# Patient Record
Sex: Female | Born: 1975
Health system: Southern US, Community
[De-identification: ages and names within clinical notes are randomized; demographics above are authoritative.]

## PROBLEM LIST (undated history)

## (undated) DIAGNOSIS — I10 Essential (primary) hypertension: Secondary | ICD-10-CM

## (undated) DIAGNOSIS — E119 Type 2 diabetes mellitus without complications: Secondary | ICD-10-CM

## (undated) HISTORY — PX: APPENDECTOMY: SHX54

---

## 2003-03-16 ENCOUNTER — Other Ambulatory Visit: Admission: RE | Admit: 2003-03-16 | Discharge: 2003-03-16 | Payer: Self-pay | Admitting: Obstetrics and Gynecology

## 2011-06-26 ENCOUNTER — Emergency Department (HOSPITAL_BASED_OUTPATIENT_CLINIC_OR_DEPARTMENT_OTHER)
Admission: EM | Admit: 2011-06-26 | Discharge: 2011-06-26 | Disposition: A | Discharge status: Home / Self Care | Payer: Self-pay | Attending: Emergency Medicine | Admitting: Emergency Medicine

## 2011-06-26 DIAGNOSIS — N390 Urinary tract infection, site not specified: Secondary | ICD-10-CM | POA: Insufficient documentation

## 2011-06-26 DIAGNOSIS — R109 Unspecified abdominal pain: Secondary | ICD-10-CM | POA: Insufficient documentation

## 2011-06-26 LAB — URINALYSIS, ROUTINE W REFLEX MICROSCOPIC
Glucose, UA: NEGATIVE mg/dL
Hgb urine dipstick: NEGATIVE
Ketones, ur: NEGATIVE mg/dL
Protein, ur: NEGATIVE mg/dL

## 2011-06-26 LAB — URINE MICROSCOPIC-ADD ON

## 2011-06-27 LAB — URINE CULTURE
Colony Count: NO GROWTH
Culture: NO GROWTH

## 2011-10-24 ENCOUNTER — Emergency Department (HOSPITAL_BASED_OUTPATIENT_CLINIC_OR_DEPARTMENT_OTHER)
Admission: EM | Admit: 2011-10-24 | Discharge: 2011-10-25 | Disposition: A | Discharge status: Home / Self Care | Payer: Self-pay | Attending: Emergency Medicine | Admitting: Emergency Medicine

## 2011-10-24 ENCOUNTER — Encounter: Payer: Self-pay | Admitting: *Deleted

## 2011-10-24 DIAGNOSIS — M79652 Pain in left thigh: Secondary | ICD-10-CM

## 2011-10-24 DIAGNOSIS — M79609 Pain in unspecified limb: Secondary | ICD-10-CM | POA: Insufficient documentation

## 2011-10-24 DIAGNOSIS — R109 Unspecified abdominal pain: Secondary | ICD-10-CM | POA: Insufficient documentation

## 2011-10-24 MED ORDER — ENOXAPARIN SODIUM 100 MG/ML ~~LOC~~ SOLN
1.0000 mg/kg | Freq: Once | SUBCUTANEOUS | Status: AC
  Administered 2011-10-25: 100 mg via SUBCUTANEOUS
  Filled 2011-10-24: qty 1

## 2011-10-24 NOTE — ED Provider Notes (Signed)
History     CSN: [id number] Arrival date & time: 10/24/2011 10:16 PM   First MD Initiated Contact with Patient 10/24/11 2317      Chief Complaint  Patient presents with  . Groin Pain    (Consider location/radiation/quality/duration/timing/severity/associated sxs/prior treatment) HPI This is a 35 year old black female with a history of pain in her left groin fold radiating down the anterior left thigh. It started yesterday. It is intermittent. It is described as a sharp pain of moderate intensity. It is not brought on by any activity. It is not mitigated by anything. There is no associated swelling, redness, fever, chills, dyspnea or chest pain. She denies recent injury or excessive use of that leg.  History reviewed. No pertinent past medical history.  Past Surgical History  Procedure Date  . Appendectomy   . Cesarean section     History reviewed. No pertinent family history.  History  Substance Use Topics  . Smoking status: Never Smoker   . Smokeless tobacco: Not on file  . Alcohol Use: No    OB History    Grav Para Term Preterm Abortions TAB SAB Ect Mult Living                  Review of Systems  All other systems reviewed and are negative.    Allergies  Penicillins  Home Medications   Current Outpatient Rx  Name Route Sig Dispense Refill  . TETRAHYDROZOLINE-ZN SULFATE 0.05-0.25 % OP SOLN Both Eyes Place 1-2 drops into both eyes once as needed. For dryness and itching       BP 135/97  Pulse 92  Temp 97.5 F (36.4 C)  Resp 16  Ht 5\' 5"  (1.651 m)  Wt 220 lb (99.791 kg)  BMI 36.61 kg/m2  SpO2 100%  LMP 10/11/2011  Physical Exam General: Well-developed, well-nourished female in no acute distress; appearance consistent with age of record HENT: normocephalic, atraumatic Eyes: Normal appearance Neck: supple Heart: regular rate and rhythm Lungs: clear to auscultation bilaterally Abdomen: soft; nontender; nondistended; bowel sounds  present Extremities: No deformity; full range of motion; pulses normal; left groin fold and thigh without tenderness, swelling, erythema, warmth or inguinal lymphadenopathy Neurologic: Awake, alert ; motor function intact in all extremities and symmetric; no facial droop Skin: Warm and dry Psychiatric: Normal mood and affect    ED Course  Procedures (including critical care time)   MDM   Nursing notes and vitals signs, including pulse oximetry, reviewed.  Summary of this visit's results, reviewed by myself:  Labs:  Results for orders placed during the hospital encounter of 10/24/11  D-DIMER, QUANTITATIVE      Component Value Range   D-Dimer, Quant 1.15 (*) 0.00 - 0.48 (ug/mL-FEU)   Will administer Lovenox have patient return for Doppler study.         Hanley Seamen, MD 10/24/11 2352

## 2011-10-24 NOTE — ED Notes (Signed)
Pt c/o left groin pain which radiates down to thigh

## 2011-10-26 ENCOUNTER — Other Ambulatory Visit (HOSPITAL_BASED_OUTPATIENT_CLINIC_OR_DEPARTMENT_OTHER): Payer: Self-pay | Admitting: Emergency Medicine

## 2011-10-26 ENCOUNTER — Inpatient Hospital Stay (HOSPITAL_BASED_OUTPATIENT_CLINIC_OR_DEPARTMENT_OTHER): Admit: 2011-10-26 | Payer: Self-pay

## 2011-10-26 ENCOUNTER — Ambulatory Visit (HOSPITAL_BASED_OUTPATIENT_CLINIC_OR_DEPARTMENT_OTHER): Admit: 2011-10-26 | Payer: Self-pay

## 2011-10-26 ENCOUNTER — Ambulatory Visit (HOSPITAL_BASED_OUTPATIENT_CLINIC_OR_DEPARTMENT_OTHER)
Admission: RE | Admit: 2011-10-26 | Discharge: 2011-10-26 | Disposition: A | Discharge status: Home / Self Care | Payer: Self-pay | Source: Ambulatory Visit | Attending: Emergency Medicine | Admitting: Emergency Medicine

## 2011-10-26 DIAGNOSIS — R52 Pain, unspecified: Secondary | ICD-10-CM

## 2011-10-26 DIAGNOSIS — M79609 Pain in unspecified limb: Secondary | ICD-10-CM | POA: Insufficient documentation

## 2011-12-03 ENCOUNTER — Emergency Department (HOSPITAL_BASED_OUTPATIENT_CLINIC_OR_DEPARTMENT_OTHER)
Admission: EM | Admit: 2011-12-03 | Discharge: 2011-12-03 | Disposition: A | Discharge status: Home / Self Care | Payer: Self-pay | Attending: Emergency Medicine | Admitting: Emergency Medicine

## 2011-12-03 ENCOUNTER — Encounter (HOSPITAL_BASED_OUTPATIENT_CLINIC_OR_DEPARTMENT_OTHER): Payer: Self-pay | Admitting: Emergency Medicine

## 2011-12-03 DIAGNOSIS — R51 Headache: Secondary | ICD-10-CM | POA: Insufficient documentation

## 2011-12-03 DIAGNOSIS — J329 Chronic sinusitis, unspecified: Secondary | ICD-10-CM | POA: Insufficient documentation

## 2011-12-03 MED ORDER — OXYMETAZOLINE HCL 0.05 % NA SOLN
1.0000 | Freq: Two times a day (BID) | NASAL | Status: DC | PRN
  Filled 2011-12-03: qty 15

## 2011-12-03 MED ORDER — DOXYCYCLINE HYCLATE 100 MG PO TABS: 100.0000 mg | ORAL_TABLET | Freq: Two times a day (BID) | ORAL | Status: AC

## 2011-12-03 NOTE — ED Provider Notes (Signed)
I saw and evaluated the patient, reviewed the resident's note and I agree with the findings and plan. Patient with symptoms of sinusitis with tenderness over the left sinus. Otherwise well-appearing  Gwyneth Sprout, MD 12/03/11 [phone]

## 2011-12-03 NOTE — ED Notes (Signed)
Pt c/o headache with left sided facial discomfort, sore throat, congestion

## 2011-12-03 NOTE — ED Provider Notes (Signed)
History     CSN: [id number] Arrival date & time: 12/03/2011 10:38 AM   First MD Initiated Contact with Patient 12/03/11 1054      Chief Complaint  Patient presents with  . Headache    (Consider location/radiation/quality/duration/timing/severity/associated sxs/prior treatment) HPI Patient is an otherwise healthy 35 year old woman who complains of 3-4 days of congestion, bilateral ear fullness, bitemporal headache that is now left-sided, mild sore throat, and rare cough. Patient also complained of left-sided tooth pain of the maxillary teeth yesterday. Also describes scant blood on tissue when cleaning her left nare. She denies fevers or chills. She works with children.  History reviewed. No pertinent past medical history.  Past Surgical History  Procedure Date  . Appendectomy   . Cesarean section     No family history on file.  History  Substance Use Topics  . Smoking status: Never Smoker   . Smokeless tobacco: Not on file  . Alcohol Use: No    OB History    Grav Para Term Preterm Abortions TAB SAB Ect Mult Living                  Review of Systems As per history of present illness Allergies  Penicillins  Home Medications   Current Outpatient Rx  Name Route Sig Dispense Refill  . TETRAHYDROZOLINE-ZN SULFATE 0.05-0.25 % OP SOLN Both Eyes Place 1-2 drops into both eyes once as needed. For dryness and itching       BP 130/87  Pulse 89  Temp(Src) 98.7 F (37.1 C) (Oral)  Resp 18  Ht 5\' 6"  (1.676 m)  Wt 225 lb (102.059 kg)  BMI 36.32 kg/m2  SpO2 100%  LMP 11/15/2011  Physical Exam Physical Exam GEN: NAD.  Alert and oriented x 3.  Pleasant, conversant, and cooperative to exam. HEENT: NCAT.  TM's clear b/l with good light reflex.  PERRLA.  EOMI.  No blood in nasopharynx.  Oropharynx clear, non erythematous, non edematous.  No cervical LAD.  No TTP over frontal or maxillary sinuses. RESP:  CTAB, no w/r/r Skin: no rashes, normal turgor  ED Course    Procedures (including critical care time)  Labs Reviewed - No data to display No results found.   No diagnosis found.    MDM  35 year old woman with likely sinusitis, with symptoms now for nearly 6 days. She did describe unilateral headache, and left upper tooth pain, concerning for bacterial sinusitis. She is, however, very well-appearing and is afebrile with normal vitals. We will prescribe her Afrin for 2 days, and if she does not have improvement, we will have a prescription for doxycycline for her. She was amenable to this plan.        Kathreen Cosier, MD 12/03/11 1125

## 2012-01-13 ENCOUNTER — Emergency Department (HOSPITAL_BASED_OUTPATIENT_CLINIC_OR_DEPARTMENT_OTHER)
Admission: EM | Admit: 2012-01-13 | Discharge: 2012-01-13 | Disposition: A | Discharge status: Home / Self Care | Payer: Self-pay | Attending: Emergency Medicine | Admitting: Emergency Medicine

## 2012-01-13 ENCOUNTER — Encounter (HOSPITAL_BASED_OUTPATIENT_CLINIC_OR_DEPARTMENT_OTHER): Payer: Self-pay

## 2012-01-13 DIAGNOSIS — N898 Other specified noninflammatory disorders of vagina: Secondary | ICD-10-CM | POA: Insufficient documentation

## 2012-01-13 DIAGNOSIS — R350 Frequency of micturition: Secondary | ICD-10-CM | POA: Insufficient documentation

## 2012-01-13 DIAGNOSIS — R109 Unspecified abdominal pain: Secondary | ICD-10-CM | POA: Insufficient documentation

## 2012-01-13 DIAGNOSIS — R300 Dysuria: Secondary | ICD-10-CM | POA: Insufficient documentation

## 2012-01-13 LAB — URINALYSIS, ROUTINE W REFLEX MICROSCOPIC
Glucose, UA: NEGATIVE mg/dL
Hgb urine dipstick: NEGATIVE
Protein, ur: NEGATIVE mg/dL

## 2012-01-13 LAB — WET PREP, GENITAL
Clue Cells Wet Prep HPF POC: NONE SEEN
Trich, Wet Prep: NONE SEEN
Yeast Wet Prep HPF POC: NONE SEEN

## 2012-01-13 MED ORDER — HYDROCODONE-ACETAMINOPHEN 5-500 MG PO TABS: 1.0000 | ORAL_TABLET | Freq: Four times a day (QID) | ORAL | Status: AC | PRN

## 2012-01-13 NOTE — ED Notes (Signed)
Pt reports intermittent low abdominal pain x 1 week and urinary discomfort x 2 days.

## 2012-01-13 NOTE — ED Provider Notes (Signed)
Medical screening examination/treatment/procedure(s) were performed by non-physician practitioner and as supervising physician I was immediately available for consultation/collaboration.   Dayton Bailiff, MD 01/13/12 [phone]

## 2012-01-13 NOTE — ED Provider Notes (Signed)
History     CSN: [id number]  Arrival date & time 01/13/12  1353   First MD Initiated Contact with Patient 01/13/12 1355      Chief Complaint  Patient presents with  . Abdominal Pain  . Dysuria    (Consider location/radiation/quality/duration/timing/severity/associated sxs/prior treatment) HPI Comments: Pt states that she is having some dysuria:pt states that she just finished her period, but she had a vaginal discharge prior:pt states that she has had intermittent bilateral sharp lower abdominal pain  Patient is a 36 y.o. female presenting with abdominal pain and dysuria. The history is provided by the patient.  Abdominal Pain The primary symptoms of the illness include abdominal pain, diarrhea, dysuria and vaginal discharge. The primary symptoms of the illness do not include fever, nausea or vomiting. The current episode started more than 2 days ago. The onset of the illness was gradual. The problem has not changed since onset. The dysuria is associated with frequency and urgency. The dysuria is not associated with hematuria.  The vaginal discharge is associated with dysuria.  Additional symptoms associated with the illness include urgency and frequency. Symptoms associated with the illness do not include hematuria or back pain.  Dysuria  Associated symptoms include frequency and urgency. Pertinent negatives include no nausea, no vomiting and no hematuria.    History reviewed. No pertinent past medical history.  Past Surgical History  Procedure Date  . Appendectomy   . Cesarean section     No family history on file.  History  Substance Use Topics  . Smoking status: Never Smoker   . Smokeless tobacco: Not on file  . Alcohol Use: No    OB History    Grav Para Term Preterm Abortions TAB SAB Ect Mult Living                  Review of Systems  Constitutional: Negative for fever.  Gastrointestinal: Positive for abdominal pain and diarrhea. Negative for nausea and  vomiting.  Genitourinary: Positive for dysuria, urgency, frequency and vaginal discharge. Negative for hematuria.  Musculoskeletal: Negative for back pain.  All other systems reviewed and are negative.    Allergies  Penicillins  Home Medications   Current Outpatient Rx  Name Route Sig Dispense Refill  . TETRAHYDROZOLINE-ZN SULFATE 0.05-0.25 % OP SOLN Both Eyes Place 1-2 drops into both eyes once as needed. For dryness and itching       BP 147/95  Pulse 85  Temp 98.1 F (36.7 C)  Resp 16  Ht 5\' 6"  (1.676 m)  Wt 220 lb (99.791 kg)  BMI 35.51 kg/m2  SpO2 100%  LMP 01/06/2012  Physical Exam  Nursing note and vitals reviewed. Constitutional: She is oriented to person, place, and time. She appears well-developed and well-nourished.  HENT:  Head: Normocephalic and atraumatic.  Eyes: Conjunctivae and EOM are normal.  Neck: Neck supple.  Cardiovascular: Normal rate and regular rhythm.   Pulmonary/Chest: Effort normal and breath sounds normal.  Abdominal: Soft. Bowel sounds are normal. There is no tenderness.  Genitourinary: Cervix exhibits no motion tenderness.       Brown vaginal discharge  Musculoskeletal: Normal range of motion.  Neurological: She is alert and oriented to person, place, and time.  Skin: Skin is warm and dry.  Psychiatric: She has a normal mood and affect.    ED Course  Procedures (including critical care time)  Labs Reviewed  WET PREP, GENITAL - Abnormal; Notable for the following:    WBC, Wet Prep HPF  POC MODERATE (*)    All other components within normal limits  URINALYSIS, ROUTINE W REFLEX MICROSCOPIC  PREGNANCY, URINE  GC/CHLAMYDIA PROBE AMP, GENITAL   No results found.   1. Abdominal pain       MDM  Abdomen is benign on exam:pt is not pregnant and exam not consistent with pid although cultures sent:will treat symptomatically        Teressa Lower, NP 01/13/12 1533

## 2012-01-14 LAB — GC/CHLAMYDIA PROBE AMP, GENITAL
Chlamydia, DNA Probe: NEGATIVE
GC Probe Amp, Genital: NEGATIVE

## 2012-06-03 ENCOUNTER — Emergency Department (HOSPITAL_BASED_OUTPATIENT_CLINIC_OR_DEPARTMENT_OTHER)
Admission: EM | Admit: 2012-06-03 | Discharge: 2012-06-03 | Disposition: A | Discharge status: Home / Self Care | Payer: Self-pay | Attending: Emergency Medicine | Admitting: Emergency Medicine

## 2012-06-03 ENCOUNTER — Encounter (HOSPITAL_BASED_OUTPATIENT_CLINIC_OR_DEPARTMENT_OTHER): Payer: Self-pay | Admitting: Family Medicine

## 2012-06-03 DIAGNOSIS — H9209 Otalgia, unspecified ear: Secondary | ICD-10-CM | POA: Insufficient documentation

## 2012-06-03 DIAGNOSIS — J069 Acute upper respiratory infection, unspecified: Secondary | ICD-10-CM

## 2012-06-03 DIAGNOSIS — J3489 Other specified disorders of nose and nasal sinuses: Secondary | ICD-10-CM | POA: Insufficient documentation

## 2012-06-03 NOTE — ED Provider Notes (Signed)
History     CSN: [id number]  Arrival date & time 06/03/12  0935   First MD Initiated Contact with Patient 06/03/12 1052      Chief Complaint  Patient presents with  . Nasal Congestion  . Otalgia    (Consider location/radiation/quality/duration/timing/severity/associated sxs/prior treatment) HPI Patient with uri symptoms for a week without fever.  Daughter with similar symptoms which have resolved.  Nasal discharge is yellow.  Patient with some right side neck stiffness without swelling for two days.  Patient without headache and full arom.  No cough or sob.  Taking bayer back and body with intemittent relief.  History reviewed. No pertinent past medical history.  Past Surgical History  Procedure Date  . Appendectomy   . Cesarean section     No family history on file.  History  Substance Use Topics  . Smoking status: Never Smoker   . Smokeless tobacco: Not on file  . Alcohol Use: No    OB History    Grav Para Term Preterm Abortions TAB SAB Ect Mult Living                  Review of Systems  Allergies  Penicillins  Home Medications   Current Outpatient Rx  Name Route Sig Dispense Refill  . TETRAHYDROZOLINE-ZN SULFATE 0.05-0.25 % OP SOLN Both Eyes Place 1-2 drops into both eyes once as needed. For dryness and itching       BP 138/100  Pulse 74  Temp 97.8 F (36.6 C) (Oral)  Resp 16  Ht 5' 5.5" (1.664 m)  Wt 217 lb (98.431 kg)  BMI 35.56 kg/m2  SpO2 100%  LMP 05/22/2012  Physical Exam  Nursing note and vitals reviewed. Constitutional: She appears well-developed and well-nourished.  HENT:  Head: Normocephalic and atraumatic.  Eyes: Conjunctivae and EOM are normal. Pupils are equal, round, and reactive to light.  Neck: Normal range of motion. Neck supple.       Mild right submandibular adenopathy, no masses, full arom  Cardiovascular: Normal rate, regular rhythm, normal heart sounds and intact distal pulses.   Pulmonary/Chest: Effort normal and  breath sounds normal.  Abdominal: Soft. Bowel sounds are normal.  Musculoskeletal: Normal range of motion.  Neurological: She is alert.  Skin: Skin is warm and dry.  Psychiatric: She has a normal mood and affect. Thought content normal.    ED Course  Procedures (including critical care time)  Labs Reviewed - No data to display No results found.   No diagnosis found.    MDM          Hilario Quarry, MD 06/03/12 1058

## 2012-06-03 NOTE — Discharge Instructions (Signed)
Antibiotic Nonuse  Your caregiver felt that the infection or problem was not one that would be helped with an antibiotic. Infections may be caused by viruses or bacteria. Only a caregiver can tell which one of these is the likely cause of an illness. A cold is the most common cause of infection in both adults and children. A cold is a virus. Antibiotic treatment will have no effect on a viral infection. Viruses can lead to many lost days of work caring for sick children and many missed days of school. Children may catch as many as 10 "colds" or "flus" per year during which they can be tearful, cranky, and uncomfortable. The goal of treating a virus is aimed at keeping the ill person comfortable. Antibiotics are medications used to help the body fight bacterial infections. There are relatively few types of bacteria that cause infections but there are hundreds of viruses. While both viruses and bacteria cause infection they are very different types of germs. A viral infection will typically go away by itself within 7 to 10 days. Bacterial infections may spread or get worse without antibiotic treatment. Examples of bacterial infections are:  Sore throats (like strep throat or tonsillitis).   Infection in the lung (pneumonia).   Ear and skin infections.  Examples of viral infections are:  Colds or flus.   Most coughs and bronchitis.   Sore throats not caused by Strep.   Runny noses.  It is often best not to take an antibiotic when a viral infection is the cause of the problem. Antibiotics can kill off the helpful bacteria that we have inside our body and allow harmful bacteria to start growing. Antibiotics can cause side effects such as allergies, nausea, and diarrhea without helping to improve the symptoms of the viral infection. Additionally, repeated uses of antibiotics can cause bacteria inside of our body to become resistant. That resistance can be passed onto harmful bacterial. The next time  you have an infection it may be harder to treat if antibiotics are used when they are not needed. Not treating with antibiotics allows our own immune system to develop and take care of infections more efficiently. Also, antibiotics will work better for us when they are prescribed for bacterial infections. Treatments for a child that is ill may include:  Give extra fluids throughout the day to stay hydrated.   Get plenty of rest.   Only give your child over-the-counter or prescription medicines for pain, discomfort, or fever as directed by your caregiver.   The use of a cool mist humidifier may help stuffy noses.   Cold medications if suggested by your caregiver.  Your caregiver may decide to start you on an antibiotic if:  The problem you were seen for today continues for a longer length of time than expected.   You develop a secondary bacterial infection.  SEEK MEDICAL CARE IF:  Fever lasts longer than 5 days.   Symptoms continue to get worse after 5 to 7 days or become severe.   Difficulty in breathing develops.   Signs of dehydration develop (poor drinking, rare urinating, dark colored urine).   Changes in behavior or worsening tiredness (listlessness or lethargy).  Document Released: 02/16/2002 Document Revised: 11/27/2011 Document Reviewed: 08/15/2009 [employer] Patient Information 2012 [employer].Saline Nose Drops  To help clear a stuffy nose, put salt water (saline) nose drops in your infant's nose. This helps to loosen the secretions in the nose. Use a bulb syringe to clean the nose out:    Before feeding.   Before putting your infant down for naps.   No more than once every 3 hours to avoid irritating your infant's nostrils.  HOME CARE  Buy nose drops at your local drug store. You can also make nose drops yourself. Mix 1 cup of water with  teaspoon of salt. Stir. Store this mixture at room temperature. Make a new batch daily.   To use the drops:   Put 1 or 2  drops in each side of infant's nose with a clean medicine dropper. Do not use this dropper for any other medicine.   Squeeze the air out of the suction bulb before inserting it into your infant's nose.   While still squeezing the bulb flat, place the tip of the bulb into a nostril. Let air come back into the bulb. The suction will pull snot out of the nose and into the bulb.   Repeat on other nostril.   Squeeze the bulb several times into a tissue and wash the bulb tip in soapy water. Store the bulb with the tip side down on paper towel.   Use the bulb syringe with only the saline drops to avoid irritating your infant's nostrils.  GET HELP RIGHT AWAY IF:  The snot changes to green or yellow.   The snot gets thicker.   Your infant is 3 months or younger with a rectal temperature of 100.4 F (38 C) or higher.   Your infant is older than 3 months with a rectal temperature of 102 F (38.9 C) or higher.   The stuffy nose lasts 10 days or longer.   There is trouble breathing or feeding.  MAKE SURE YOU:  Understand these instructions.   Will watch your infant's condition.   Will get help right away if your infant is not doing well or gets worse.  Document Released: 10/05/2009 Document Revised: 11/27/2011 Document Reviewed: 10/05/2009 [employer] Patient Information 2012 [employer]. 

## 2012-06-03 NOTE — ED Notes (Signed)
Pt c/o nasal congestion, drainage and right ear pain with pain behind right ear and down right side of neck x 1 wk. Denies fever, n/v/d.

## 2012-11-15 ENCOUNTER — Encounter (HOSPITAL_BASED_OUTPATIENT_CLINIC_OR_DEPARTMENT_OTHER): Payer: Self-pay | Admitting: *Deleted

## 2012-11-15 ENCOUNTER — Emergency Department (HOSPITAL_BASED_OUTPATIENT_CLINIC_OR_DEPARTMENT_OTHER)
Admission: EM | Admit: 2012-11-15 | Discharge: 2012-11-15 | Disposition: A | Discharge status: Home / Self Care | Payer: Self-pay | Attending: Emergency Medicine | Admitting: Emergency Medicine

## 2012-11-15 DIAGNOSIS — M542 Cervicalgia: Secondary | ICD-10-CM | POA: Insufficient documentation

## 2012-11-15 DIAGNOSIS — J329 Chronic sinusitis, unspecified: Secondary | ICD-10-CM | POA: Insufficient documentation

## 2012-11-15 DIAGNOSIS — H9209 Otalgia, unspecified ear: Secondary | ICD-10-CM | POA: Insufficient documentation

## 2012-11-15 LAB — RAPID STREP SCREEN (MED CTR MEBANE ONLY): Streptococcus, Group A Screen (Direct): NEGATIVE

## 2012-11-15 MED ORDER — DOXYCYCLINE HYCLATE 100 MG PO CAPS: 100.0000 mg | ORAL_CAPSULE | Freq: Two times a day (BID) | ORAL | Status: AC

## 2012-11-15 MED ORDER — FLUTICASONE PROPIONATE 50 MCG/ACT NA SUSP: 2.0000 | Freq: Every day | NASAL | Status: AC

## 2012-11-15 NOTE — ED Notes (Signed)
Headache, earache and neck pain since last week.

## 2012-11-15 NOTE — ED Notes (Signed)
Chart reviewed.

## 2012-11-15 NOTE — ED Provider Notes (Signed)
History  This chart was scribed for Glynn Octave, MD by Ardeen Jourdain, ED Scribe. This patient was seen in room MH03/MH03 and the patient's care was started at 1601.   CSN: [id number]  Arrival date & time 11/15/12  1549   First MD Initiated Contact with Patient 11/15/12 1601      Chief Complaint  Patient presents with  . Headache     The history is provided by the patient. No language interpreter was used.    Katherine Figueroa is a 36 y.o. female who presents to the Emergency Department complaining of persistent HA, ear ache and neck pain with associated rhinorrhea. She states the pain started 1 week ago on Friday and has been gradually worsening since. She reports having HA in the past but nothing this severe. She denies fever and cough. She admits to possible sick contact. She states turning her head aggravates the pain. She reports taking OTC sinus medication with no relief. She does not have a h/o pertinent or chronic medical conditions. She denies smoking and alcohol use.  PCP: None    History reviewed. No pertinent past medical history.  Past Surgical History  Procedure Date  . Appendectomy   . Cesarean section     No family history on file.  History  Substance Use Topics  . Smoking status: Never Smoker   . Smokeless tobacco: Not on file  . Alcohol Use: No   No OB history available.   Review of Systems  All other systems reviewed and are negative.  A complete 10 system review of systems was obtained and all systems are negative except as noted in the HPI and PMH.    Allergies  Penicillins  Home Medications   Current Outpatient Rx  Name  Route  Sig  Dispense  Refill  . TETRAHYDROZOLINE-ZN SULFATE 0.05-0.25 % OP SOLN   Both Eyes   Place 1-2 drops into both eyes once as needed. For dryness and itching            Triage Vitals: BP 144/104  Pulse 81  Temp 97.8 F (36.6 C) (Oral)  Resp 18  Ht 5\' 6"  (1.676 m)  Wt 220 lb (99.791 kg)  BMI 35.51  kg/m2  SpO2 100%  Physical Exam  Nursing note and vitals reviewed. Constitutional: She is oriented to person, place, and time. She appears well-developed and well-nourished. No distress.  HENT:  Head: Normocephalic and atraumatic.  Right Ear: External ear normal.  Left Ear: External ear normal.  Mouth/Throat: Oropharynx is clear and moist. No oropharyngeal exudate.       Frontal sinus tenderness, boggy nasal mucus membranes   Eyes: EOM are normal. Pupils are equal, round, and reactive to light.  Neck: Normal range of motion. Neck supple. No tracheal deviation present.       No meningismus   Cardiovascular: Normal rate, regular rhythm and normal heart sounds.   Pulmonary/Chest: Effort normal and breath sounds normal. No respiratory distress.  Abdominal: Soft. She exhibits no distension.  Musculoskeletal: Normal range of motion. She exhibits no edema.  Neurological: She is alert and oriented to person, place, and time. No cranial nerve deficit.       No ataxia, 5/5 strength throughout  Skin: Skin is warm and dry.  Psychiatric: She has a normal mood and affect. Her behavior is normal.    ED Course  Procedures (including critical care time)  DIAGNOSTIC STUDIES: Oxygen Saturation is 100% on room air, normal by my interpretation.  COORDINATION OF CARE:  4:14 PM: Discussed treatment plan which includes nasal steroids and throat swab with pt at bedside and pt agreed to plan.    Labs Reviewed - No data to display No results found.   No diagnosis found.    MDM  One week of gradual onset headache associated with right paraspinal neck pain, rhinorrhea, bilateral ear fullness. No fever, chills, nausea or vomiting. Sick contacts at home. Denies thunderclap onset.  Appears well, afebrile, no meningismus, intact neuro exam.  TTP frontal sinuses.  We'll treat for sinusitis with antibiotics, decongestants and nasal steroids.      I personally performed the services described  in this documentation, which was scribed in my presence. The recorded information has been reviewed and is accurate.   Glynn Octave, MD 11/15/12 [phone]

## 2015-08-12 ENCOUNTER — Encounter (HOSPITAL_BASED_OUTPATIENT_CLINIC_OR_DEPARTMENT_OTHER): Payer: Self-pay | Admitting: Emergency Medicine

## 2015-08-12 ENCOUNTER — Emergency Department (HOSPITAL_BASED_OUTPATIENT_CLINIC_OR_DEPARTMENT_OTHER)
Admission: EM | Admit: 2015-08-12 | Discharge: 2015-08-12 | Disposition: A | Discharge status: Home / Self Care | Payer: BC Managed Care – PPO | Attending: Emergency Medicine | Admitting: Emergency Medicine

## 2015-08-12 DIAGNOSIS — Z792 Long term (current) use of antibiotics: Secondary | ICD-10-CM | POA: Insufficient documentation

## 2015-08-12 DIAGNOSIS — Z880 Allergy status to penicillin: Secondary | ICD-10-CM | POA: Insufficient documentation

## 2015-08-12 DIAGNOSIS — Z7951 Long term (current) use of inhaled steroids: Secondary | ICD-10-CM | POA: Diagnosis not present

## 2015-08-12 DIAGNOSIS — Z79899 Other long term (current) drug therapy: Secondary | ICD-10-CM | POA: Diagnosis not present

## 2015-08-12 DIAGNOSIS — M79652 Pain in left thigh: Secondary | ICD-10-CM | POA: Diagnosis not present

## 2015-08-12 LAB — BASIC METABOLIC PANEL
Anion gap: 9 (ref 5–15)
BUN: 14 mg/dL (ref 6–20)
CALCIUM: 8.9 mg/dL (ref 8.9–10.3)
CHLORIDE: 105 mmol/L (ref 101–111)
CO2: 25 mmol/L (ref 22–32)
CREATININE: 0.81 mg/dL (ref 0.44–1.00)
GFR calc Af Amer: >60 mL/min (ref >60)
GFR calc non Af Amer: >60 mL/min (ref >60)
GLUCOSE: 116 mg/dL — AB (ref 65–99)
Potassium: 3.9 mmol/L (ref 3.5–5.1)
Sodium: 139 mmol/L (ref 135–145)

## 2015-08-12 MED ORDER — IBUPROFEN 400 MG PO TABS
600.0000 mg | ORAL_TABLET | Freq: Once | ORAL | Status: AC
  Administered 2015-08-12: 600 mg via ORAL
  Filled 2015-08-12 (×2): qty 1

## 2015-08-12 NOTE — ED Notes (Signed)
Patient states for the last 2 -3 days she has had intermittent pain to her left thigh. "It is not a constant pain it just comes all of a sudden" .

## 2015-08-12 NOTE — ED Notes (Signed)
Pt states her left leg is hurting at present. MD to evaluate and spoke with patient. Rates pain 3/10. Will medicate per order.

## 2015-08-12 NOTE — ED Notes (Signed)
MD with pt  

## 2015-08-12 NOTE — ED Provider Notes (Signed)
CSN: [id number]     Arrival date & time 08/12/15  0447 History   First MD Initiated Contact with Patient 08/12/15 [phone]     Chief Complaint  Patient presents with  . Leg Pain     (Consider location/radiation/quality/duration/timing/severity/associated sxs/prior Treatment) HPI  This is a 39 year old female with a 3 to four-day history of intermittent pain in the left thigh. The pain is sharp, deep in her left thigh, and lasts about 1 second at a time. It is happened several times a day. It has happened when she was standing. It happened this morning at 4 AM awakening her from sleep. There does not appear to be any trigger. She states the pain feels like it is deep into her thigh but sometimes in the front of the thigh and sometimes in the back of the thigh. She has not injured her thigh and there is no associated swelling, erythema or warmth. She is pain-free at the present time but states the pain is a 10 out of 10 when it happens. She is on metformin for prediabetes but does not always take it because it tends to cause diarrhea.  History reviewed. No pertinent past medical history. Past Surgical History  Procedure Laterality Date  . Appendectomy    . Cesarean section     History reviewed. No pertinent family history. Social History  Substance Use Topics  . Smoking status: Never Smoker   . Smokeless tobacco: None  . Alcohol Use: No   OB History    No data available     Review of Systems  All other systems reviewed and are negative.   Allergies  Penicillins  Home Medications   Prior to Admission medications   Medication Sig Start Date End Date Taking? Authorizing Provider  metFORMIN (GLUCOPHAGE) 500 MG tablet Take 500 mg by mouth 2 (two) times daily with a meal.   Yes Historical Provider, MD  doxycycline (VIBRAMYCIN) 100 MG capsule Take 1 capsule (100 mg total) by mouth 2 (two) times daily. 11/15/12   Glynn Octave, MD  fluticasone (FLONASE) 50 MCG/ACT nasal spray Place 2  sprays into the nose daily. 11/15/12   Glynn Octave, MD  tetrahydrozoline-zinc (VISINE-AC) 0.05-0.25 % ophthalmic solution Place 1-2 drops into both eyes once as needed. For dryness and itching     Historical Provider, MD   BP 145/86 mmHg  Pulse 89  Temp(Src) 98.9 F (37.2 C) (Oral)  Resp 20  SpO2 99%  LMP 07/15/2015   Physical Exam  General: Well-developed, well-nourished female in no acute distress; appearance consistent with age of record HENT: normocephalic; atraumatic Eyes: pupils equal, round and reactive to light; extraocular muscles intact Neck: supple Heart: regular rate and rhythm Lungs: clear to auscultation bilaterally Abdomen: soft; nondistended; nontender; bowel sounds present Extremities: No deformity; full range of motion; pulses normal; no thigh tenderness, mass or edema Neurologic: Awake, alert and oriented; motor function intact in all extremities and symmetric; no facial droop Skin: Warm and dry Psychiatric: Normal mood and affect    ED Course  Procedures (including critical care time)   MDM  Nursing notes and vitals signs, including pulse oximetry, reviewed.  Summary of this visit's results, reviewed by myself:  Labs:  Results for orders placed or performed during the hospital encounter of 08/12/15 (from the past 24 hour(s))  Basic metabolic panel     Status: Abnormal   Collection Time: 08/12/15  5:15 AM  Result Value Ref Range   Sodium 139 135 - 145 mmol/L  Potassium 3.9 3.5 - 5.1 mmol/L   Chloride 105 101 - 111 mmol/L   CO2 25 22 - 32 mmol/L   Glucose, Bld 116 (H) 65 - 99 mg/dL   BUN 14 6 - 20 mg/dL   Creatinine, Ser 1.61 0.44 - 1.00 mg/dL   Calcium 8.9 8.9 - 09.6 mg/dL   GFR calc non Af Amer >60 >60 mL/min   GFR calc Af Amer >60 >60 mL/min   Anion gap 9 5 - 15     Costantino Kohlbeck, MD 08/12/15 0559

## 2015-11-30 ENCOUNTER — Emergency Department (HOSPITAL_BASED_OUTPATIENT_CLINIC_OR_DEPARTMENT_OTHER): Payer: BC Managed Care – PPO

## 2015-11-30 ENCOUNTER — Encounter (HOSPITAL_BASED_OUTPATIENT_CLINIC_OR_DEPARTMENT_OTHER): Payer: Self-pay | Admitting: *Deleted

## 2015-11-30 ENCOUNTER — Emergency Department (HOSPITAL_BASED_OUTPATIENT_CLINIC_OR_DEPARTMENT_OTHER)
Admission: EM | Admit: 2015-11-30 | Discharge: 2015-11-30 | Disposition: A | Discharge status: Home / Self Care | Payer: BC Managed Care – PPO | Attending: Emergency Medicine | Admitting: Emergency Medicine

## 2015-11-30 DIAGNOSIS — Y998 Other external cause status: Secondary | ICD-10-CM | POA: Insufficient documentation

## 2015-11-30 DIAGNOSIS — Z7984 Long term (current) use of oral hypoglycemic drugs: Secondary | ICD-10-CM | POA: Insufficient documentation

## 2015-11-30 DIAGNOSIS — Z7951 Long term (current) use of inhaled steroids: Secondary | ICD-10-CM | POA: Insufficient documentation

## 2015-11-30 DIAGNOSIS — S40012A Contusion of left shoulder, initial encounter: Secondary | ICD-10-CM | POA: Insufficient documentation

## 2015-11-30 DIAGNOSIS — Z79899 Other long term (current) drug therapy: Secondary | ICD-10-CM | POA: Insufficient documentation

## 2015-11-30 DIAGNOSIS — S4992XA Unspecified injury of left shoulder and upper arm, initial encounter: Secondary | ICD-10-CM | POA: Diagnosis present

## 2015-11-30 DIAGNOSIS — Z880 Allergy status to penicillin: Secondary | ICD-10-CM | POA: Insufficient documentation

## 2015-11-30 DIAGNOSIS — Y9289 Other specified places as the place of occurrence of the external cause: Secondary | ICD-10-CM | POA: Insufficient documentation

## 2015-11-30 DIAGNOSIS — W208XXA Other cause of strike by thrown, projected or falling object, initial encounter: Secondary | ICD-10-CM | POA: Diagnosis not present

## 2015-11-30 DIAGNOSIS — Y9389 Activity, other specified: Secondary | ICD-10-CM | POA: Insufficient documentation

## 2015-11-30 LAB — PREGNANCY, URINE: Preg Test, Ur: NEGATIVE

## 2015-11-30 MED ORDER — METHOCARBAMOL 500 MG PO TABS: 500.0000 mg | ORAL_TABLET | Freq: Two times a day (BID) | ORAL | Status: AC

## 2015-11-30 MED ORDER — MELOXICAM 15 MG PO TABS: 15.0000 mg | ORAL_TABLET | Freq: Every day | ORAL | Status: AC

## 2015-11-30 MED ORDER — METHOCARBAMOL 500 MG PO TABS
1000.0000 mg | ORAL_TABLET | Freq: Once | ORAL | Status: AC
  Administered 2015-11-30: 1000 mg via ORAL
  Filled 2015-11-30: qty 2

## 2015-11-30 MED ORDER — NAPROXEN 250 MG PO TABS
500.0000 mg | ORAL_TABLET | Freq: Once | ORAL | Status: AC
  Administered 2015-11-30: 500 mg via ORAL
  Filled 2015-11-30: qty 2

## 2015-11-30 NOTE — ED Notes (Signed)
Display boxes of shampoo fell  And hit pt left shoulder,  C/o pain to left shoulder and left back

## 2015-11-30 NOTE — Discharge Instructions (Signed)

## 2015-11-30 NOTE — ED Notes (Signed)
Pt states that a [employer] disply full of Shampoo fell over on her presents left shoulder pain and left back pain

## 2015-11-30 NOTE — ED Provider Notes (Signed)
CSN: [id number]     Arrival date & time 11/30/15  2220 History  By signing my name below, I, Doreatha Martin, attest that this documentation has been prepared under the direction and in the presence of Myrle Wanek, MD. Electronically Signed: Doreatha Martin, ED Scribe. 11/30/2015. 11:34 PM.    Chief Complaint  Patient presents with  . Shoulder Pain   Patient is a 39 y.o. female presenting with shoulder pain. The history is provided by the patient. No language interpreter was used.  Shoulder Pain Location:  Shoulder Time since incident:  2 hours Injury: yes   Mechanism of injury: crush   Crush injury:    Mechanism:  Falling object Shoulder location:  L shoulder Pain details:    Quality:  Aching   Radiates to:  Does not radiate   Severity:  Moderate   Timing:  Constant   Progression:  Unchanged Chronicity:  New Dislocation: no   Foreign body present:  No foreign bodies Prior injury to area:  No Relieved by:  None tried Worsened by:  Movement Associated symptoms: no muscle weakness and no numbness     HPI Comments: Katherine Figueroa is a 39 y.o. female who presents to the Emergency Department complaining of moderate left shoulder pain s/p injury that occurred 2 hours ago. Pt states that a display of shampoo fell on her shoulder. Pt states pain is worsened with movement. She denies LOC, head injury, numbness, weakness, paresthesia, additional injuries.   History reviewed. No pertinent past medical history. Past Surgical History  Procedure Laterality Date  . Appendectomy    . Cesarean section     History reviewed. No pertinent family history. Social History  Substance Use Topics  . Smoking status: Never Smoker   . Smokeless tobacco: None  . Alcohol Use: No   OB History    No data available     Review of Systems  Musculoskeletal: Positive for arthralgias.  All other systems reviewed and are negative.  Allergies  Penicillins  Home Medications   Prior to Admission  medications   Medication Sig Start Date End Date Taking? Authorizing Provider  doxycycline (VIBRAMYCIN) 100 MG capsule Take 1 capsule (100 mg total) by mouth 2 (two) times daily. 11/15/12   Glynn Octave, MD  fluticasone (FLONASE) 50 MCG/ACT nasal spray Place 2 sprays into the nose daily. 11/15/12   Glynn Octave, MD  metFORMIN (GLUCOPHAGE) 500 MG tablet Take 500 mg by mouth 2 (two) times daily with a meal.    Historical Provider, MD  tetrahydrozoline-zinc (VISINE-AC) 0.05-0.25 % ophthalmic solution Place 1-2 drops into both eyes once as needed. For dryness and itching     Historical Provider, MD   BP 150/111 mmHg  Pulse 89  Temp(Src) 98.4 F (36.9 C) (Oral)  Wt 220 lb (99.791 kg)  SpO2 100%  LMP 10/22/2015 Physical Exam  Constitutional: She is oriented to person, place, and time. She appears well-developed and well-nourished.  HENT:  Head: Normocephalic and atraumatic.  Mouth/Throat: Oropharynx is clear and moist.  Eyes: Conjunctivae and EOM are normal. Pupils are equal, round, and reactive to light.  Neck: Normal range of motion. Neck supple.  Cardiovascular: Normal rate, regular rhythm and normal heart sounds.  Exam reveals no gallop and no friction rub.   No murmur heard. Pulmonary/Chest: Effort normal and breath sounds normal. No respiratory distress. She has no wheezes. She has no rales. She exhibits no tenderness.  No bruising or stridor. Lungs CTA bilaterally.   Abdominal: Soft. Bowel  sounds are normal. She exhibits no distension. There is no tenderness.  Musculoskeletal: Normal range of motion. She exhibits no edema or tenderness.       Left shoulder: Normal. She exhibits normal range of motion, no tenderness, no bony tenderness, no swelling, no crepitus, no deformity, no laceration, no pain, no spasm, normal pulse and normal strength.       Left elbow: Normal.       Left wrist: Normal.  Clavicle intact. Negative neer's test. 5/5 Strength in the left arm and sensation  intact. Good DTRs of bicep, radio brachialis. No winging of the scapula. no step offs or crepitance of the c t or l spine.    Neurological: She is alert and oriented to person, place, and time. She has normal strength and normal reflexes. She displays no atrophy. No sensory deficit. She exhibits normal muscle tone. GCS eye subscore is 4. GCS verbal subscore is 5. GCS motor subscore is 6.  Reflex Scores:      Tricep reflexes are 2+ on the left side.      Bicep reflexes are 2+ on the left side.      Brachioradialis reflexes are 2+ on the left side. Skin: Skin is warm and dry.  Psychiatric: She has a normal mood and affect. Her behavior is normal.  Nursing note and vitals reviewed.  ED Course  Procedures (including critical care time) DIAGNOSTIC STUDIES: Oxygen Saturation is 100% on RA, normal by my interpretation.    COORDINATION OF CARE: 11:32 PM Discussed treatment plan with pt at bedside and pt agreed to plan. Discussed negative XR.    Labs Review Labs Reviewed  PREGNANCY, URINE    Imaging Review Dg Shoulder Left  11/30/2015  CLINICAL DATA:  Left shoulder pain, injury 2 hours ago, a display fell on patient's left shoulder EXAM: LEFT SHOULDER - 2+ VIEW COMPARISON:  None. FINDINGS: There is no evidence of fracture or dislocation. There is no evidence of arthropathy or other focal bone abnormality. Soft tissues are unremarkable. IMPRESSION: Negative. Electronically Signed   By: Natasha MeadLiviu  Pop M.D.   On: 11/30/2015 23:20   I have personally reviewed and evaluated these images and lab results as part of my medical decision-making.  MDM   Final diagnoses:  None    Shoulder contusions.  NSAIDS/ muscle relaxants and ice to the affected area 20 minutes every 2 hours with gentle stretching exercises.     I personally performed the services described in this documentation, which was scribed in my presence. The recorded information has been reviewed and is accurate.     Cy BlamerApril Biff Rutigliano,  MD 12/01/15 [phone]

## 2015-12-01 ENCOUNTER — Encounter (HOSPITAL_BASED_OUTPATIENT_CLINIC_OR_DEPARTMENT_OTHER): Payer: Self-pay | Admitting: Emergency Medicine

## 2017-03-20 ENCOUNTER — Emergency Department (HOSPITAL_BASED_OUTPATIENT_CLINIC_OR_DEPARTMENT_OTHER)
Admission: EM | Admit: 2017-03-20 | Discharge: 2017-03-20 | Disposition: A | Discharge status: Home / Self Care | Payer: No Typology Code available for payment source | Attending: Emergency Medicine | Admitting: Emergency Medicine

## 2017-03-20 ENCOUNTER — Encounter (HOSPITAL_BASED_OUTPATIENT_CLINIC_OR_DEPARTMENT_OTHER): Payer: Self-pay | Admitting: Emergency Medicine

## 2017-03-20 ENCOUNTER — Emergency Department (HOSPITAL_BASED_OUTPATIENT_CLINIC_OR_DEPARTMENT_OTHER): Payer: No Typology Code available for payment source

## 2017-03-20 DIAGNOSIS — Y999 Unspecified external cause status: Secondary | ICD-10-CM | POA: Insufficient documentation

## 2017-03-20 DIAGNOSIS — R51 Headache: Secondary | ICD-10-CM | POA: Insufficient documentation

## 2017-03-20 DIAGNOSIS — S161XXA Strain of muscle, fascia and tendon at neck level, initial encounter: Secondary | ICD-10-CM | POA: Diagnosis not present

## 2017-03-20 DIAGNOSIS — E119 Type 2 diabetes mellitus without complications: Secondary | ICD-10-CM | POA: Diagnosis not present

## 2017-03-20 DIAGNOSIS — I10 Essential (primary) hypertension: Secondary | ICD-10-CM | POA: Diagnosis not present

## 2017-03-20 DIAGNOSIS — Z7984 Long term (current) use of oral hypoglycemic drugs: Secondary | ICD-10-CM | POA: Diagnosis not present

## 2017-03-20 DIAGNOSIS — Y92410 Unspecified street and highway as the place of occurrence of the external cause: Secondary | ICD-10-CM | POA: Insufficient documentation

## 2017-03-20 DIAGNOSIS — Y9389 Activity, other specified: Secondary | ICD-10-CM | POA: Diagnosis not present

## 2017-03-20 DIAGNOSIS — S199XXA Unspecified injury of neck, initial encounter: Secondary | ICD-10-CM | POA: Diagnosis present

## 2017-03-20 HISTORY — DX: Essential (primary) hypertension: I10

## 2017-03-20 HISTORY — DX: Type 2 diabetes mellitus without complications: E11.9

## 2017-03-20 MED ORDER — CYCLOBENZAPRINE HCL 10 MG PO TABS: 10.0000 mg | ORAL_TABLET | Freq: Two times a day (BID) | ORAL | 0 refills | Status: AC | PRN

## 2017-03-20 MED ORDER — NAPROXEN 500 MG PO TABS: 500.0000 mg | ORAL_TABLET | Freq: Two times a day (BID) | ORAL | 0 refills | Status: AC

## 2017-03-20 MED FILL — CYCLOBENZAPRINE 10 MG TAB: 10 | 5 days supply | Qty: 10 | Fill #0

## 2017-03-20 MED FILL — NAPROXEN 500 MG TABLET: 500 | 7 days supply | Qty: 14 | Fill #0

## 2017-03-20 NOTE — ED Notes (Signed)
Patient transported to xray and CT. 
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
## 2017-03-20 NOTE — ED Notes (Signed)
ED Provider at bedside. 
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
## 2017-10-06 ENCOUNTER — Emergency Department (HOSPITAL_BASED_OUTPATIENT_CLINIC_OR_DEPARTMENT_OTHER)
Admission: EM | Admit: 2017-10-06 | Discharge: 2017-10-06 | Disposition: A | Discharge status: Home / Self Care | Payer: Worker's Compensation | Attending: Emergency Medicine | Admitting: Emergency Medicine

## 2017-10-06 ENCOUNTER — Encounter (HOSPITAL_BASED_OUTPATIENT_CLINIC_OR_DEPARTMENT_OTHER): Payer: Self-pay | Admitting: Emergency Medicine

## 2017-10-06 ENCOUNTER — Emergency Department (HOSPITAL_BASED_OUTPATIENT_CLINIC_OR_DEPARTMENT_OTHER): Payer: Worker's Compensation

## 2017-10-06 DIAGNOSIS — E119 Type 2 diabetes mellitus without complications: Secondary | ICD-10-CM | POA: Diagnosis not present

## 2017-10-06 DIAGNOSIS — S8991XA Unspecified injury of right lower leg, initial encounter: Secondary | ICD-10-CM | POA: Diagnosis present

## 2017-10-06 DIAGNOSIS — S76011A Strain of muscle, fascia and tendon of right hip, initial encounter: Secondary | ICD-10-CM | POA: Insufficient documentation

## 2017-10-06 DIAGNOSIS — S8391XA Sprain of unspecified site of right knee, initial encounter: Secondary | ICD-10-CM | POA: Diagnosis not present

## 2017-10-06 DIAGNOSIS — Z79899 Other long term (current) drug therapy: Secondary | ICD-10-CM | POA: Diagnosis not present

## 2017-10-06 DIAGNOSIS — Y929 Unspecified place or not applicable: Secondary | ICD-10-CM | POA: Insufficient documentation

## 2017-10-06 DIAGNOSIS — S93491A Sprain of other ligament of right ankle, initial encounter: Secondary | ICD-10-CM

## 2017-10-06 DIAGNOSIS — Y9389 Activity, other specified: Secondary | ICD-10-CM | POA: Diagnosis not present

## 2017-10-06 DIAGNOSIS — M79604 Pain in right leg: Secondary | ICD-10-CM

## 2017-10-06 DIAGNOSIS — Y998 Other external cause status: Secondary | ICD-10-CM | POA: Diagnosis not present

## 2017-10-06 DIAGNOSIS — Z7984 Long term (current) use of oral hypoglycemic drugs: Secondary | ICD-10-CM | POA: Insufficient documentation

## 2017-10-06 DIAGNOSIS — I10 Essential (primary) hypertension: Secondary | ICD-10-CM | POA: Insufficient documentation

## 2017-10-06 DIAGNOSIS — W109XXA Fall (on) (from) unspecified stairs and steps, initial encounter: Secondary | ICD-10-CM | POA: Insufficient documentation

## 2017-10-06 MED ORDER — IBUPROFEN 800 MG PO TABS
800.0000 mg | ORAL_TABLET | Freq: Once | ORAL | Status: AC
  Administered 2017-10-06: 800 mg via ORAL
  Filled 2017-10-06: qty 1

## 2017-10-06 MED ORDER — ACETAMINOPHEN 500 MG PO TABS
1000.0000 mg | ORAL_TABLET | Freq: Once | ORAL | Status: AC
  Administered 2017-10-06: 1000 mg via ORAL
  Filled 2017-10-06: qty 2

## 2017-10-06 NOTE — Discharge Instructions (Signed)
Take 4 over the counter ibuprofen tablets 3 times a day or 2 over-the-counter naproxen tablets twice a day for pain. Also take tylenol 1000mg(2 extra strength) four times a day.    

## 2017-10-06 NOTE — ED Triage Notes (Signed)
Pt c/o RLE pain s/p falling up about 3 steps today

## 2017-10-06 NOTE — ED Provider Notes (Signed)
[facility] EMERGENCY DEPARTMENT Provider Note   CSN: [id number] Arrival date & time: 10/06/17  1018     History   Chief Complaint Chief Complaint  Patient presents with  . Leg Pain    HPI Katherine Figueroa is a 41 y.o. female.  41 yo F with a chief complaint of a fall. Patient was walking up some steps and slipped and fell. She is unsure how she landed. Complaining of pain to the right leg. Denies head injury or loss of consciousness denies low back pain. Denies abdominal pain. Denies any other injury. Worse with bearing weight. She has been able to ambulate.   The history is provided by the patient.  Leg Pain   This is a new problem. The current episode started less than 1 hour ago. The problem occurs constantly. The problem has not changed since onset.The pain is present in the right hip, right knee and right ankle. The quality of the pain is described as aching. The pain is at a severity of 4/10. The pain is mild. She has tried nothing for the symptoms. The treatment provided no relief. There has been a history of trauma.    Past Medical History:  Diagnosis Date  . Diabetes mellitus without complication (HCC)   . Hypertension     There are no active problems to display for this patient.   Past Surgical History:  Procedure Laterality Date  . APPENDECTOMY    . CESAREAN SECTION      OB History    No data available       Home Medications    Prior to Admission medications   Medication Sig Start Date End Date Taking? Authorizing Provider  chlorthalidone (HYGROTON) 25 MG tablet Take 25 mg by mouth daily.   Yes [provider]  cyclobenzaprine (FLEXERIL) 10 MG tablet Take 1 tablet (10 mg total) by mouth 2 (two) times daily as needed for muscle spasms. 03/20/17   Tilden Fossa, MD  doxycycline (VIBRAMYCIN) 100 MG capsule Take 1 capsule (100 mg total) by mouth 2 (two) times daily. 11/15/12   Rancour, Jeannett Senior, MD  fluticasone (FLONASE) 50 MCG/ACT  nasal spray Place 2 sprays into the nose daily. 11/15/12   Rancour, Jeannett Senior, MD  meloxicam (MOBIC) 15 MG tablet Take 1 tablet (15 mg total) by mouth daily. 11/30/15   Palumbo, April, MD  metFORMIN (GLUCOPHAGE) 500 MG tablet Take 500 mg by mouth 2 (two) times daily with a meal.    [provider]  methocarbamol (ROBAXIN) 500 MG tablet Take 1 tablet (500 mg total) by mouth 2 (two) times daily. 11/30/15   Palumbo, April, MD  naproxen (NAPROSYN) 500 MG tablet Take 1 tablet (500 mg total) by mouth 2 (two) times daily. 03/20/17   Tilden Fossa, MD  tetrahydrozoline-zinc (VISINE-AC) 0.05-0.25 % ophthalmic solution Place 1-2 drops into both eyes once as needed. For dryness and itching     [provider]    Family History No family history on file.  Social History Social History  Substance Use Topics  . Smoking status: Never Smoker  . Smokeless tobacco: Never Used  . Alcohol use Yes     Allergies   Penicillins   Review of Systems Review of Systems  Constitutional: Negative for chills and fever.  HENT: Negative for congestion and rhinorrhea.   Eyes: Negative for redness and visual disturbance.  Respiratory: Negative for shortness of breath and wheezing.   Cardiovascular: Negative for chest pain and palpitations.  Gastrointestinal:  Negative for nausea and vomiting.  Genitourinary: Negative for dysuria and urgency.  Musculoskeletal: Positive for arthralgias and myalgias.  Skin: Negative for pallor and wound.  Neurological: Negative for dizziness and headaches.     Physical Exam Updated Vital Signs BP (!) 137/97 (BP Location: Right Arm)   Pulse 79   Temp 99.2 F (37.3 C) (Oral)   Resp 16   Ht  (1.676 m)   Wt 97.5 kg (215 lb)   LMP 10/02/2017   SpO2 100%   BMI 34.70 kg/m   Physical Exam  Constitutional: She is oriented to person, place, and time. She appears well-developed and well-nourished. No distress.  HENT:  Head: Normocephalic and atraumatic.    Eyes: Pupils are equal, round, and reactive to light. EOM are normal.  Neck: Normal range of motion. Neck supple.  Cardiovascular: Normal rate and regular rhythm.  Exam reveals no gallop and no friction rub.   No murmur heard. Pulmonary/Chest: Effort normal. She has no wheezes. She has no rales.  Abdominal: Soft. She exhibits no distension. There is no tenderness.  Musculoskeletal: She exhibits tenderness. She exhibits no edema.  Small bruise to the right anterior knee. Full range of motion. Pulse motor and sensation intact distally. Some mild pain to the ATF without bony tenderness to the right ankle. Mild inguinal tenderness. Full range of motion. No pain with internal/external rotation of the leg. Able to flex at the hip without difficulty. No midline spinal tenderness.  Neurological: She is alert and oriented to person, place, and time.  Skin: Skin is warm and dry. She is not diaphoretic.  Psychiatric: She has a normal mood and affect. Her behavior is normal.  Nursing note and vitals reviewed.    ED Treatments / Results  Labs (all labs ordered are listed, but only abnormal results are displayed) Labs Reviewed - No data to display  EKG  EKG Interpretation None       Radiology Dg Ankle Complete Right  Result Date: 10/06/2017 CLINICAL DATA:  Right ankle pain after falling up stairs. EXAM: RIGHT ANKLE - COMPLETE 3+ VIEW COMPARISON:  None. FINDINGS: No acute bony abnormality. Specifically, no fracture, subluxation, or dislocation. Soft tissues are intact. Plantar calcaneal spur. IMPRESSION: No acute bony abnormality. Electronically Signed   By: Charlett Nose M.D.   On: 10/06/2017 11:23   Dg Knee Complete 4 Views Right  Result Date: 10/06/2017 CLINICAL DATA:  Status post fall, right knee. EXAM: RIGHT KNEE - COMPLETE 4+ VIEW COMPARISON:  None. FINDINGS: No fracture or dislocation is seen. The joint spaces are preserved. Visualized soft tissues are within normal limits. No  suprapatellar knee joint effusion. IMPRESSION: Negative. Electronically Signed   By: Charline Bills M.D.   On: 10/06/2017 11:23    Procedures Procedures (including critical care time)  Medications Ordered in ED Medications  acetaminophen (TYLENOL) tablet 1,000 mg (1,000 mg Oral Given 10/06/17 1122)  ibuprofen (ADVIL,MOTRIN) tablet 800 mg (800 mg Oral Given 10/06/17 1122)     Initial Impression / Assessment and Plan / ED Course  I have reviewed the triage vital signs and the nursing notes.  Pertinent labs & imaging results that were available during my care of the patient were reviewed by me and considered in my medical decision making (see chart for details).     41 yo F with a chief complaint of right leg pain post fall. Mechanical fall. No other areas of noted tenderness. I doubt fracture based on exam and the patient began  to ambulate. Will obtain a plain film of the right knee in the right ankle.  X-rays are negative. Place in a knee brace and an ankle brace. Crutches. PCP follow-up.  11:54 AM:  I have discussed the diagnosis/risks/treatment options with the patient and family and believe the pt to be eligible for discharge home to follow-up with PCP. We also discussed returning to the ED immediately if new or worsening sx occur. We discussed the sx which are most concerning (e.g., sudden worsening pain, fever, inability to tolerate by mouth) that necessitate immediate return. Medications administered to the patient during their visit and any new prescriptions provided to the patient are listed below.  Medications given during this visit Medications  acetaminophen (TYLENOL) tablet 1,000 mg (1,000 mg Oral Given 10/06/17 1122)  ibuprofen (ADVIL,MOTRIN) tablet 800 mg (800 mg Oral Given 10/06/17 1122)     The patient appears reasonably screen and/or stabilized for discharge and I doubt any other medical condition or other [facility] requiring further screening, evaluation, or treatment  in the ED at this time prior to discharge.    Final Clinical Impressions(s) / ED Diagnoses   Final diagnoses:  Right leg pain  Sprain of right knee, unspecified ligament, initial encounter  Sprain of anterior talofibular ligament of right ankle, initial encounter  Hip strain, right, initial encounter    New Prescriptions New Prescriptions   No medications on file     Melene Plan, DO 10/06/17 1154

## 2018-04-10 IMAGING — CR DG ANKLE COMPLETE 3+V*R*
3 series · 3 of 3 positions shown · non-contrast
Comparison: None.

CLINICAL DATA: Right ankle pain after falling up stairs.

EXAM:
RIGHT ANKLE - COMPLETE 3+ VIEW

[t ankle joint ap right]
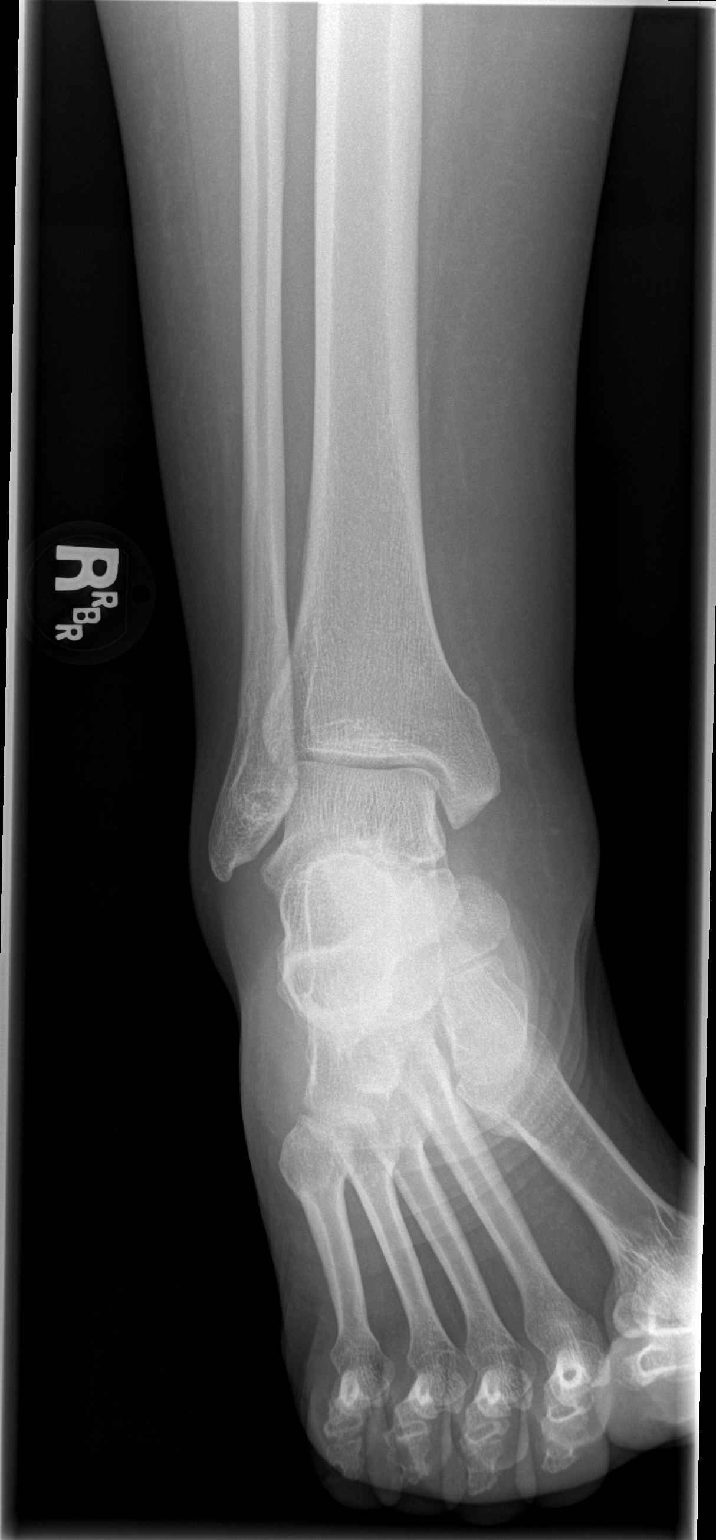

[t ankle joint oblique right]
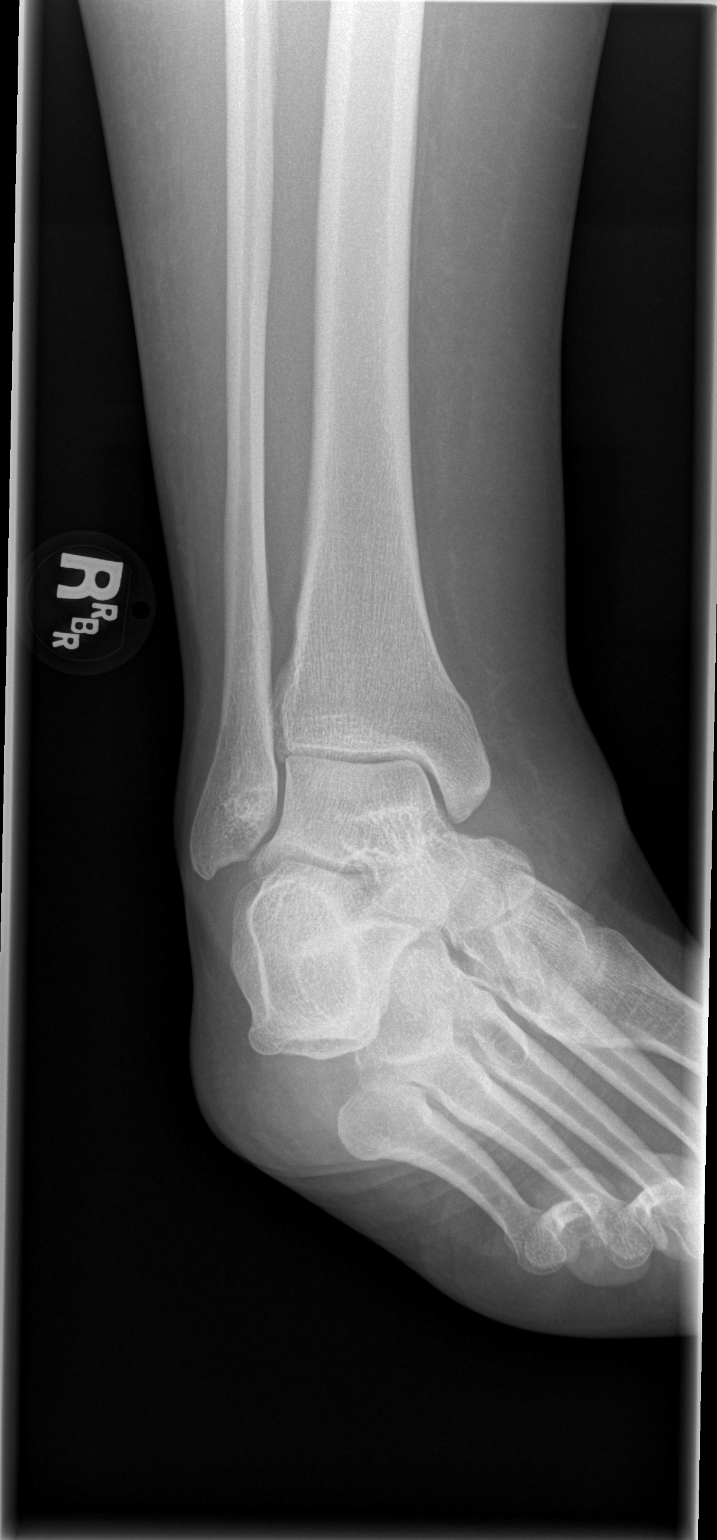

[t ankle joint lat right]
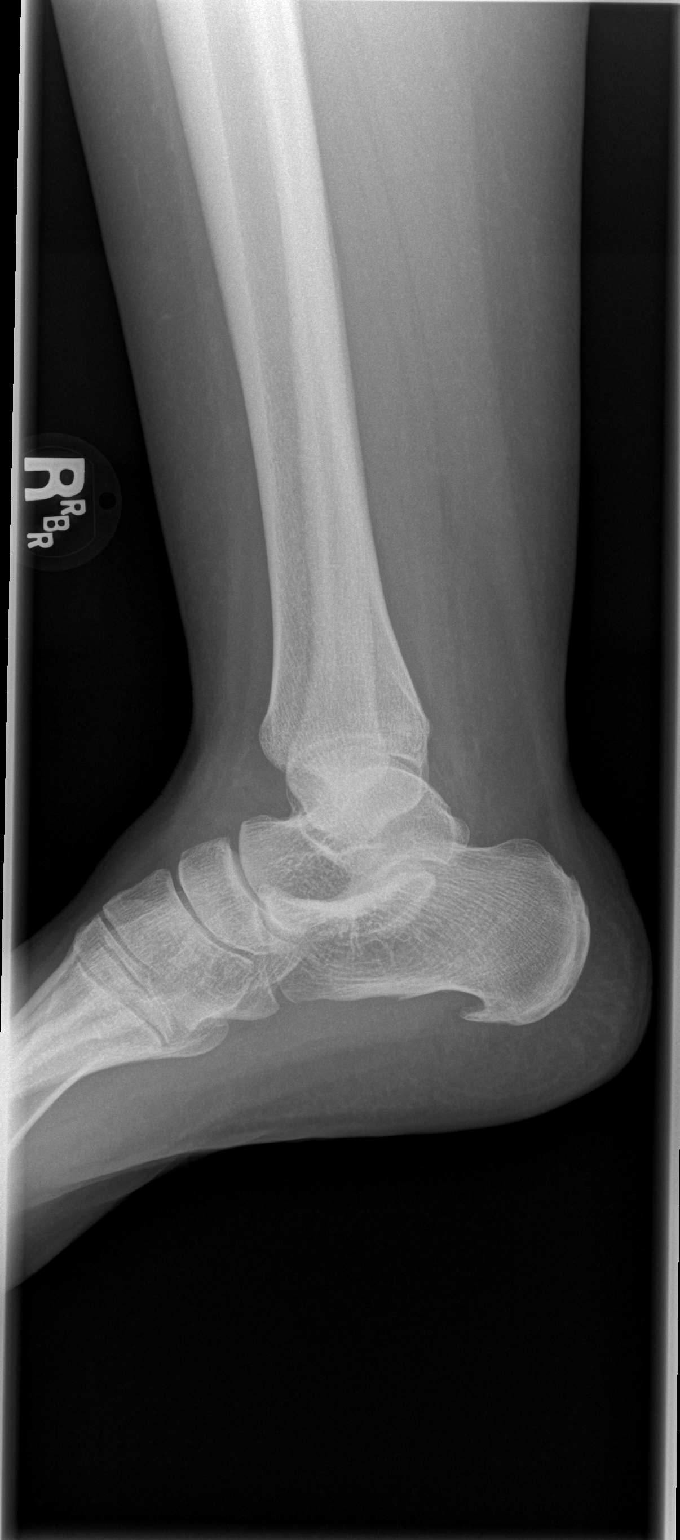

[3 of 3 positions shown; findings below may reference images not displayed]

FINDINGS: No acute bony abnormality. Specifically, no fracture, subluxation,
or dislocation. Soft tissues are intact. Plantar calcaneal spur.
IMPRESSION: No acute bony abnormality.

## 2018-04-10 IMAGING — CR DG KNEE COMPLETE 4+V*R*
4 series · 4 of 4 positions shown · non-contrast
Comparison: None.

CLINICAL DATA: Status post fall, right knee.

EXAM:
RIGHT KNEE - COMPLETE 4+ VIEW

[t knee ap right]
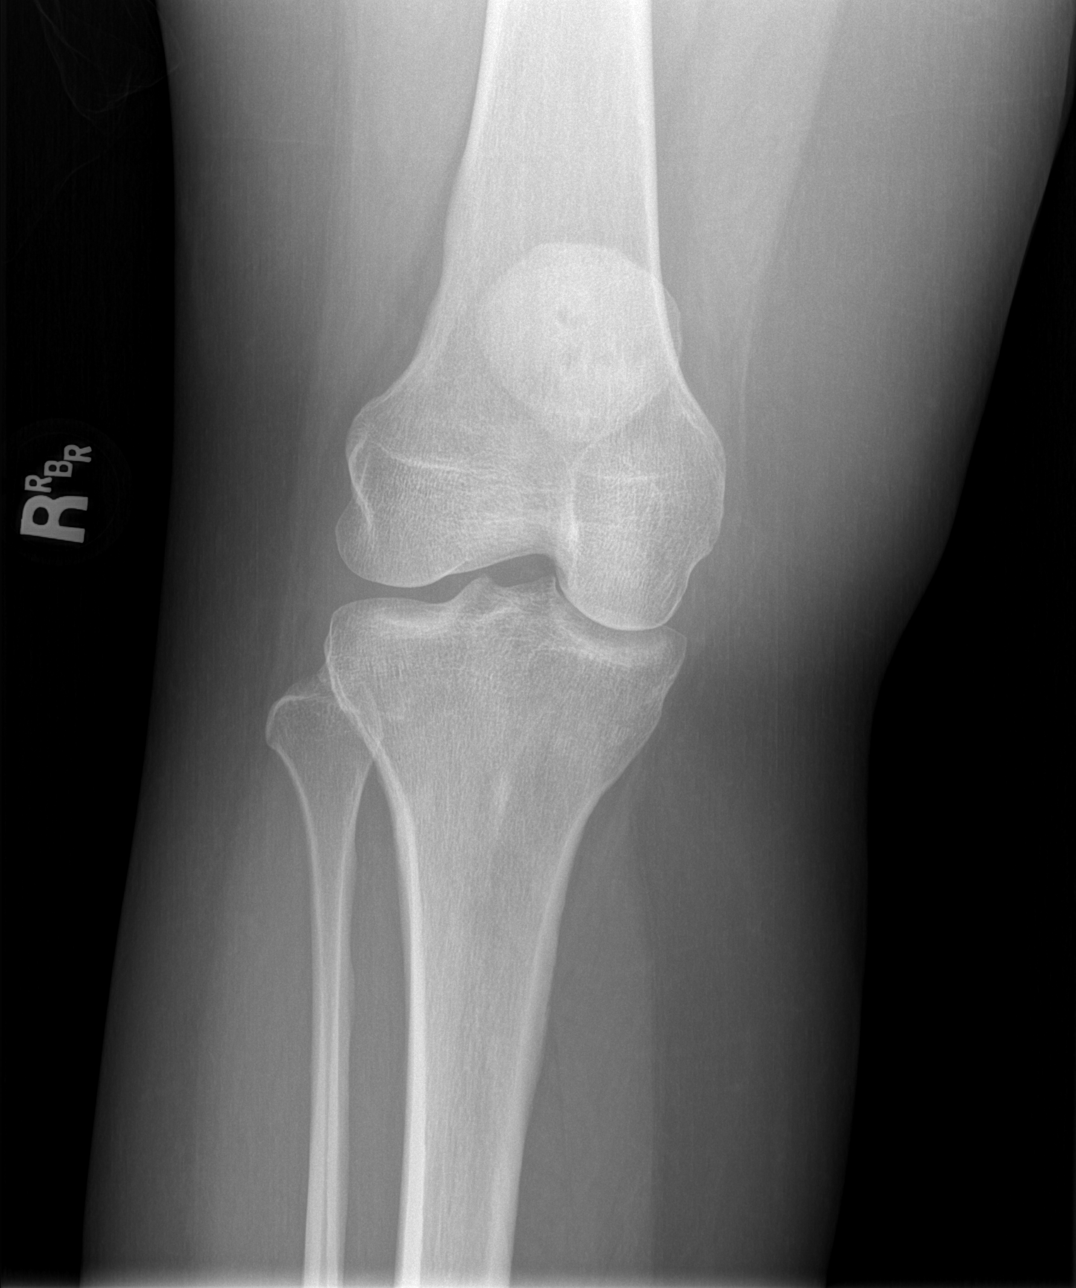

[t knee oblique right (1 of 2)]
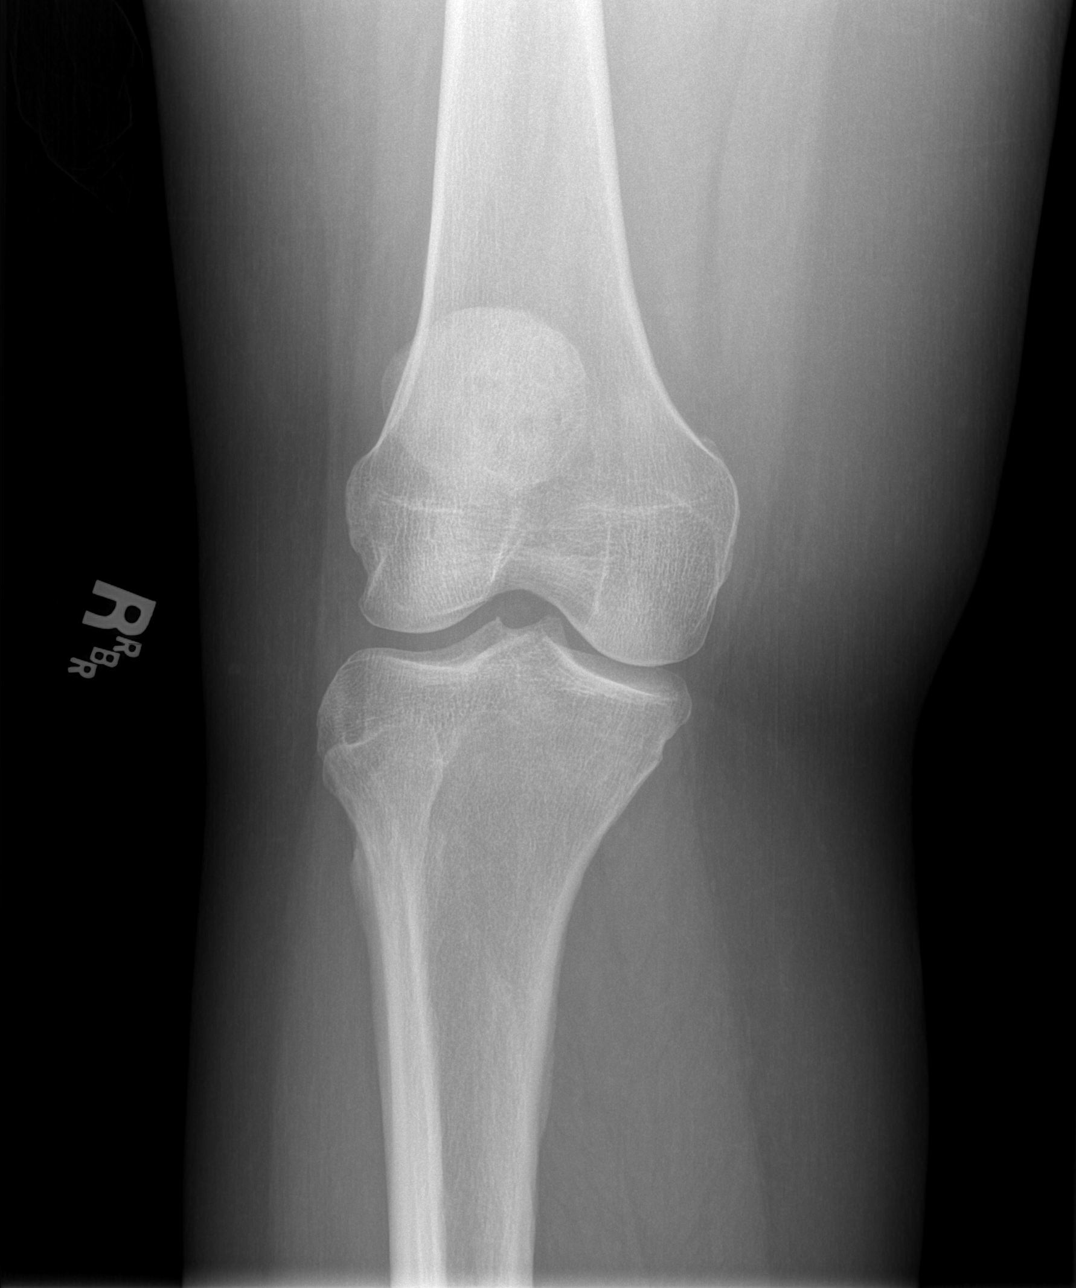

[t knee oblique right (2 of 2)]
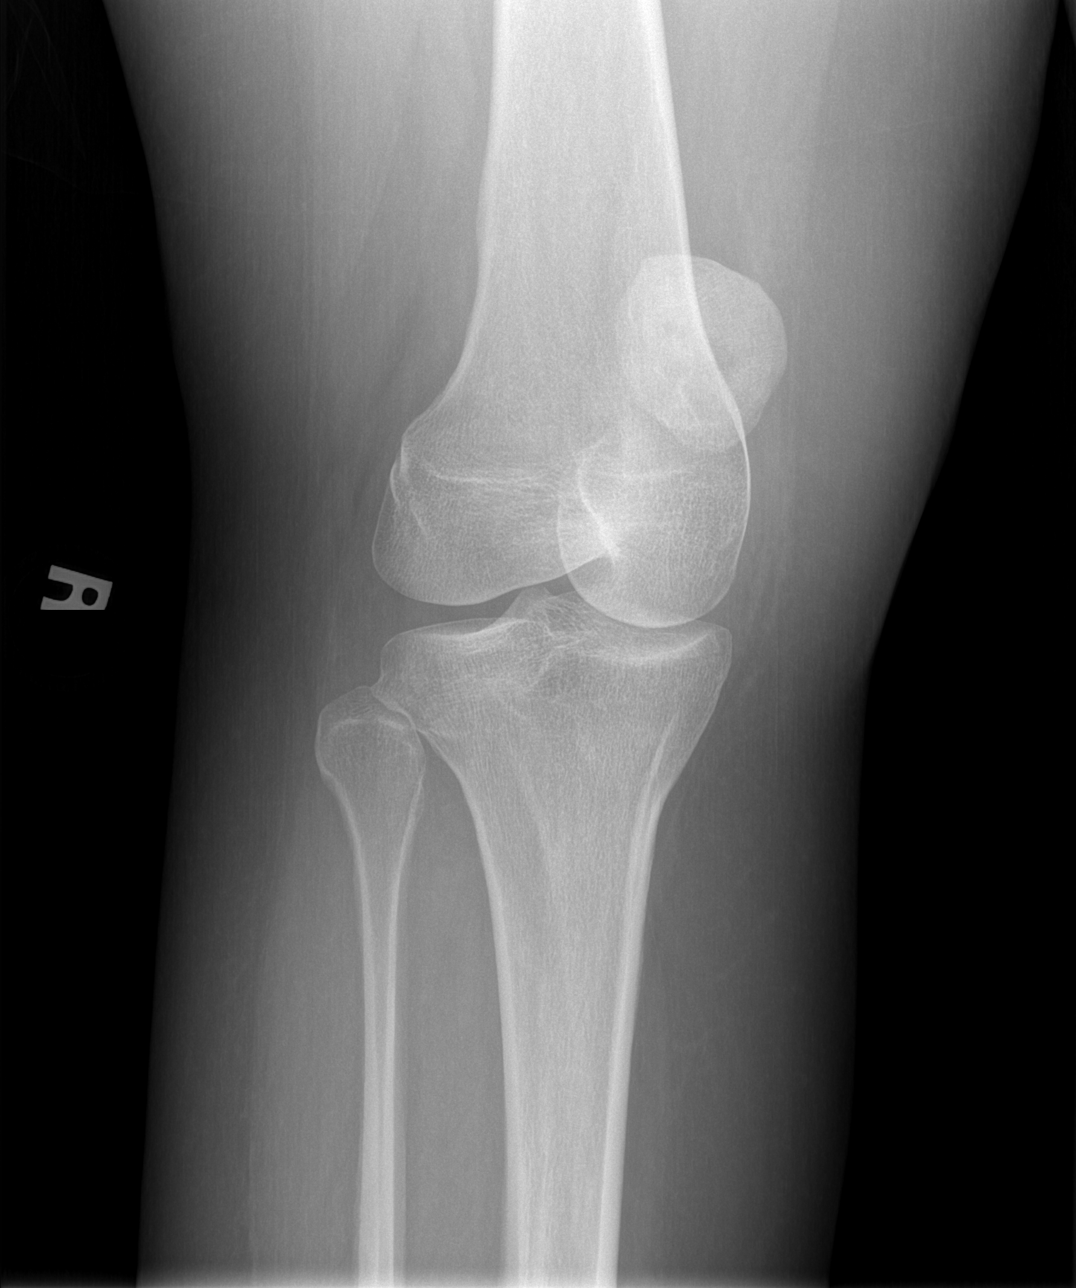

[t knee lat right]
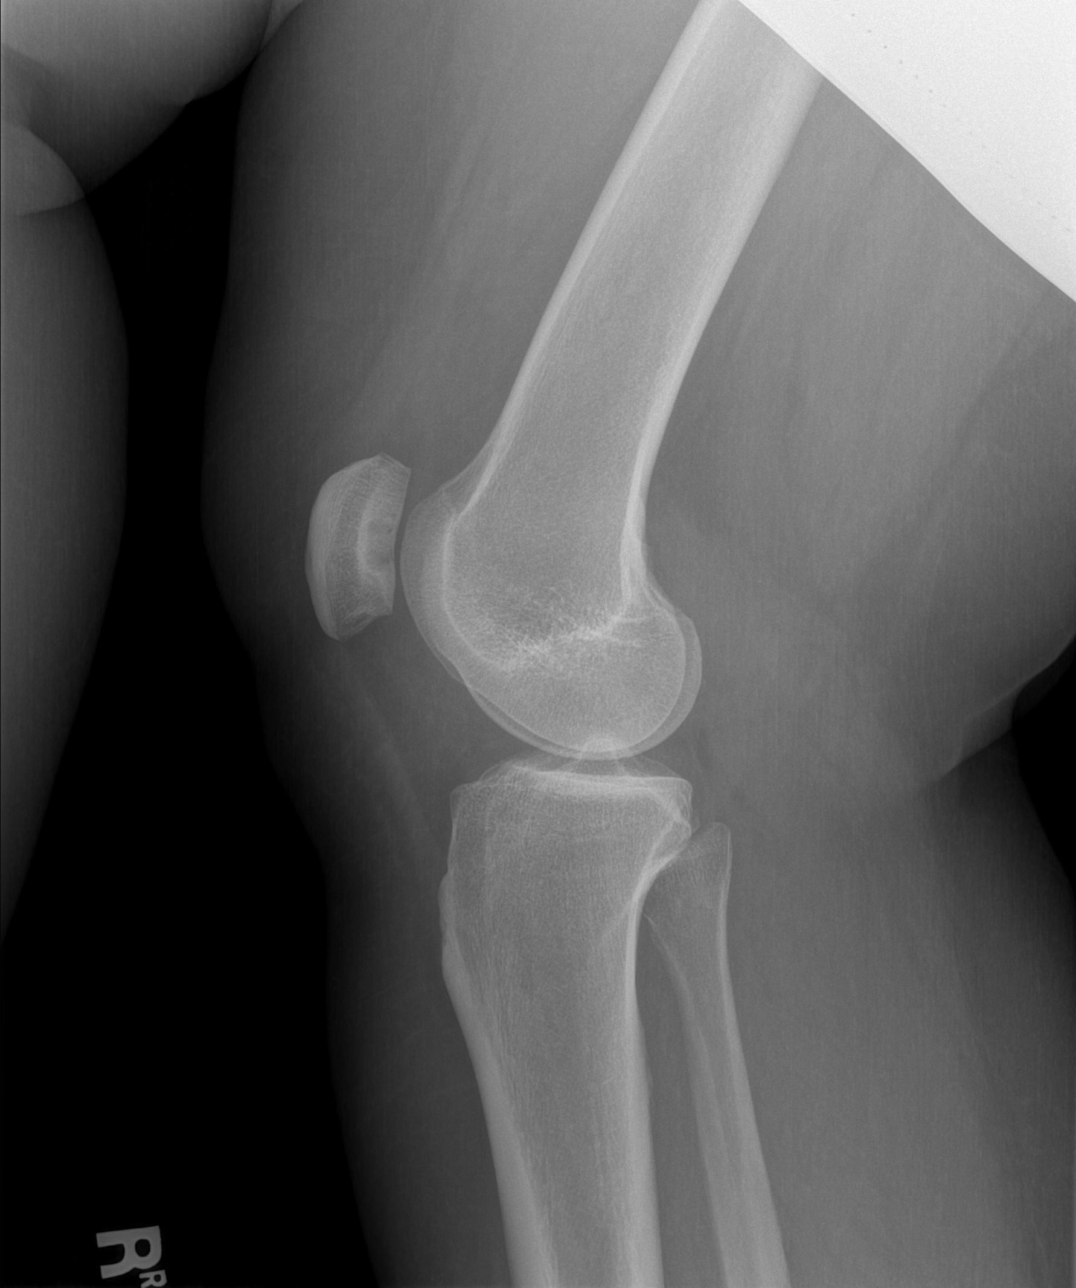

[4 of 4 positions shown; findings below may reference images not displayed]

FINDINGS: No fracture or dislocation is seen.

The joint spaces are preserved.

Visualized soft tissues are within normal limits.

No suprapatellar knee joint effusion.
IMPRESSION: Negative.

## 2018-05-07 ENCOUNTER — Encounter (HOSPITAL_BASED_OUTPATIENT_CLINIC_OR_DEPARTMENT_OTHER): Payer: Self-pay | Admitting: Emergency Medicine

## 2018-05-07 ENCOUNTER — Other Ambulatory Visit: Payer: Self-pay

## 2018-05-07 ENCOUNTER — Emergency Department (HOSPITAL_BASED_OUTPATIENT_CLINIC_OR_DEPARTMENT_OTHER)
Admission: EM | Admit: 2018-05-07 | Discharge: 2018-05-07 | Disposition: A | Discharge status: Home / Self Care | Payer: No Typology Code available for payment source | Attending: Emergency Medicine | Admitting: Emergency Medicine

## 2018-05-07 DIAGNOSIS — Y9389 Activity, other specified: Secondary | ICD-10-CM | POA: Diagnosis not present

## 2018-05-07 DIAGNOSIS — Y9289 Other specified places as the place of occurrence of the external cause: Secondary | ICD-10-CM | POA: Diagnosis not present

## 2018-05-07 DIAGNOSIS — Y990 Civilian activity done for income or pay: Secondary | ICD-10-CM | POA: Diagnosis not present

## 2018-05-07 DIAGNOSIS — S0990XA Unspecified injury of head, initial encounter: Secondary | ICD-10-CM

## 2018-05-07 DIAGNOSIS — Z7984 Long term (current) use of oral hypoglycemic drugs: Secondary | ICD-10-CM | POA: Insufficient documentation

## 2018-05-07 DIAGNOSIS — I10 Essential (primary) hypertension: Secondary | ICD-10-CM | POA: Diagnosis not present

## 2018-05-07 DIAGNOSIS — Z79899 Other long term (current) drug therapy: Secondary | ICD-10-CM | POA: Diagnosis not present

## 2018-05-07 DIAGNOSIS — E119 Type 2 diabetes mellitus without complications: Secondary | ICD-10-CM | POA: Diagnosis not present

## 2018-05-07 DIAGNOSIS — G44319 Acute post-traumatic headache, not intractable: Secondary | ICD-10-CM | POA: Insufficient documentation

## 2018-05-07 DIAGNOSIS — W2209XA Striking against other stationary object, initial encounter: Secondary | ICD-10-CM | POA: Insufficient documentation

## 2018-05-07 NOTE — ED Triage Notes (Signed)
Pt states a piece of a door frame fell and hit her on the head at work today. Denies LOC.

## 2018-05-07 NOTE — ED Provider Notes (Signed)
[facility] EMERGENCY DEPARTMENT Provider Note   CSN: [id number] Arrival date & time: 05/07/18  1350     History   Chief Complaint Chief Complaint  Patient presents with  . Head Injury    HPI Katherine Figueroa is a 42 y.o. female.  Patient presents with headache after being hit in the head by a wooden doorframe prior to arrival at work. No loss of consciousness. No neurologic signs or symptoms. No vomiting. No blood thinners.mild headache at the site. Location of injury anterior aspect of skull midline.     Past Medical History:  Diagnosis Date  . Diabetes mellitus without complication (HCC)   . Hypertension     There are no active problems to display for this patient.   Past Surgical History:  Procedure Laterality Date  . APPENDECTOMY    . CESAREAN SECTION       OB History   None      Home Medications    Prior to Admission medications   Medication Sig Start Date End Date Taking? Authorizing Provider  chlorthalidone (HYGROTON) 25 MG tablet Take 25 mg by mouth daily.    [provider]  cyclobenzaprine (FLEXERIL) 10 MG tablet Take 1 tablet (10 mg total) by mouth 2 (two) times daily as needed for muscle spasms. 03/20/17   Tilden Fossa, MD  doxycycline (VIBRAMYCIN) 100 MG capsule Take 1 capsule (100 mg total) by mouth 2 (two) times daily. 11/15/12   Rancour, Jeannett Senior, MD  fluticasone (FLONASE) 50 MCG/ACT nasal spray Place 2 sprays into the nose daily. 11/15/12   Rancour, Jeannett Senior, MD  meloxicam (MOBIC) 15 MG tablet Take 1 tablet (15 mg total) by mouth daily. 11/30/15   Palumbo, April, MD  metFORMIN (GLUCOPHAGE) 500 MG tablet Take 500 mg by mouth 2 (two) times daily with a meal.    [provider]  methocarbamol (ROBAXIN) 500 MG tablet Take 1 tablet (500 mg total) by mouth 2 (two) times daily. 11/30/15   Palumbo, April, MD  naproxen (NAPROSYN) 500 MG tablet Take 1 tablet (500 mg total) by mouth 2 (two) times daily. 03/20/17   Tilden Fossa, MD  tetrahydrozoline-zinc (VISINE-AC) 0.05-0.25 % ophthalmic solution Place 1-2 drops into both eyes once as needed. For dryness and itching     [provider]    Family History No family history on file.  Social History Social History   Tobacco Use  . Smoking status: Never Smoker  . Smokeless tobacco: Never Used  Substance Use Topics  . Alcohol use: Yes  . Drug use: No     Allergies   Penicillins   Review of Systems Review of Systems  Constitutional: Negative for chills and fever.  HENT: Negative for congestion.   Eyes: Negative for visual disturbance.  Respiratory: Negative for shortness of breath.   Cardiovascular: Negative for chest pain.  Gastrointestinal: Negative for abdominal pain and vomiting.  Genitourinary: Negative for dysuria and flank pain.  Musculoskeletal: Negative for back pain, neck pain and neck stiffness.  Skin: Negative for rash.  Neurological: Positive for headaches. Negative for light-headedness.     Physical Exam Updated Vital Signs BP (!) 149/95 (BP Location: Left Arm)   Pulse 95   Temp 98.4 F (36.9 C) (Oral)   Resp 16   Ht  (1.676 m)   Wt 102.1 kg (225 lb)   LMP 05/06/2018   SpO2 99%   BMI 36.32 kg/m   Physical Exam  Constitutional: She is oriented to person,  place, and time. She appears well-developed and well-nourished.  HENT:  Head: Normocephalic and atraumatic.  Patient is mild tenderness anterior midline region of skull. No deformity no step-off no hematoma. No bleeding. Neck supple for range of motion.  Eyes: Conjunctivae are normal. Right eye exhibits no discharge. Left eye exhibits no discharge.  Neck: Normal range of motion. Neck supple. No tracheal deviation present.  Cardiovascular: Normal rate.  Pulmonary/Chest: Effort normal.  Musculoskeletal: She exhibits no deformity.  Neurological: She is alert and oriented to person, place, and time. Coordination and gait normal.  5+ strength in UE and  LE with f/e at major joints. CNs 2-12 grossly intact.  EOMFI.  PERRL.   Finger nose and coordination intact bilateral.   Visual fields intact to finger testing. No nystagmus   Skin: Skin is warm. No rash noted.  Psychiatric: She has a normal mood and affect.  Nursing note and vitals reviewed.    ED Treatments / Results  Labs (all labs ordered are listed, but only abnormal results are displayed) Labs Reviewed - No data to display  EKG None  Radiology No results found.  Procedures Procedures (including critical care time)  Medications Ordered in ED Medications - No data to display   Initial Impression / Assessment and Plan / ED Course  I have reviewed the triage vital signs and the nursing notes.  Pertinent labs & imaging results that were available during my care of the patient were reviewed by me and considered in my medical decision making (see chart for details).    Patient presents after low risk head injury. No indication for emergent CT scan of this time. Discussed reasons to return. Final Clinical Impressions(s) / ED Diagnoses   Final diagnoses:  Closed head injury, initial encounter  Acute headache due to traumatic injury of head    ED Discharge Orders    None       Blane Ohara, MD 05/07/18 1445

## 2018-05-07 NOTE — Discharge Instructions (Signed)
Make sure you see a provider if you develop persistent vomiting, confusion, lethargy, neurologic symptoms or signs or other concerns. Tylenol and Motrin as needed for pain.
